# Patient Record
Sex: Female | Born: 1985 | Race: White | Hispanic: No | Marital: Single | Smoking: Never smoker
Health system: Southern US, Community
[De-identification: ages and names within clinical notes are randomized; demographics above are authoritative.]

## PROBLEM LIST (undated history)

## (undated) DIAGNOSIS — G809 Cerebral palsy, unspecified: Secondary | ICD-10-CM

## (undated) HISTORY — PX: LEG SURGERY: SHX1003

---

## 2016-10-11 ENCOUNTER — Inpatient Hospital Stay
Admission: EM | Admit: 2016-10-11 | Discharge: 2016-10-12 | DRG: 204 | Disposition: A | Discharge status: Home / Self Care | Payer: Medicare Other | Attending: Internal Medicine | Admitting: Internal Medicine

## 2016-10-11 ENCOUNTER — Emergency Department: Payer: Medicare Other

## 2016-10-11 DIAGNOSIS — L8990 Pressure ulcer of unspecified site, unspecified stage: Secondary | ICD-10-CM | POA: Insufficient documentation

## 2016-10-11 DIAGNOSIS — D696 Thrombocytopenia, unspecified: Secondary | ICD-10-CM | POA: Diagnosis present

## 2016-10-11 DIAGNOSIS — R6813 Apparent life threatening event in infant (ALTE): Secondary | ICD-10-CM

## 2016-10-11 DIAGNOSIS — Z79899 Other long term (current) drug therapy: Secondary | ICD-10-CM | POA: Diagnosis not present

## 2016-10-11 DIAGNOSIS — R1310 Dysphagia, unspecified: Secondary | ICD-10-CM | POA: Diagnosis present

## 2016-10-11 DIAGNOSIS — R9431 Abnormal electrocardiogram [ECG] [EKG]: Secondary | ICD-10-CM | POA: Diagnosis present

## 2016-10-11 DIAGNOSIS — R69 Illness, unspecified: Secondary | ICD-10-CM

## 2016-10-11 DIAGNOSIS — J690 Pneumonitis due to inhalation of food and vomit: Secondary | ICD-10-CM | POA: Diagnosis present

## 2016-10-11 DIAGNOSIS — G809 Cerebral palsy, unspecified: Secondary | ICD-10-CM | POA: Diagnosis present

## 2016-10-11 DIAGNOSIS — R0602 Shortness of breath: Secondary | ICD-10-CM | POA: Diagnosis not present

## 2016-10-11 HISTORY — DX: Cerebral palsy, unspecified: G80.9

## 2016-10-11 LAB — CBC WITH DIFFERENTIAL/PLATELET
BASOS ABS: 0 10*3/uL (ref 0–0.1)
Basophils Relative: 0 %
EOS ABS: 0 10*3/uL (ref 0–0.7)
EOS PCT: 0 %
HCT: 41.7 % (ref 35.0–47.0)
HEMOGLOBIN: 14.4 g/dL (ref 12.0–16.0)
LYMPHS PCT: 12 %
Lymphs Abs: 1.3 10*3/uL (ref 1.0–3.6)
MCH: 33 pg (ref 26.0–34.0)
MCHC: 34.5 g/dL (ref 32.0–36.0)
MCV: 95.8 fL (ref 80.0–100.0)
Monocytes Absolute: 0.2 10*3/uL (ref 0.2–0.9)
Monocytes Relative: 2 %
NEUTROS PCT: 86 %
Neutro Abs: 9.1 10*3/uL — ABNORMAL HIGH (ref 1.4–6.5)
PLATELETS: 76 10*3/uL — AB (ref 150–440)
RBC: 4.35 MIL/uL (ref 3.80–5.20)
RDW: 16.4 % — ABNORMAL HIGH (ref 11.5–14.5)
WBC: 10.6 10*3/uL (ref 3.6–11.0)

## 2016-10-11 LAB — COMPREHENSIVE METABOLIC PANEL
ALT: 21 U/L (ref 14–54)
AST: 24 U/L (ref 15–41)
Albumin: 2.8 g/dL — ABNORMAL LOW (ref 3.5–5.0)
Alkaline Phosphatase: 111 U/L (ref 38–126)
Anion gap: 11 (ref 5–15)
BILIRUBIN TOTAL: 0.4 mg/dL (ref 0.3–1.2)
BUN: 12 mg/dL (ref 6–20)
CO2: 26 mmol/L (ref 22–32)
Calcium: 9.7 mg/dL (ref 8.9–10.3)
Chloride: 105 mmol/L (ref 101–111)
Creatinine, Ser: 0.38 mg/dL — ABNORMAL LOW (ref 0.44–1.00)
Glucose, Bld: 129 mg/dL — ABNORMAL HIGH (ref 65–99)
Potassium: 3.5 mmol/L (ref 3.5–5.1)
Sodium: 142 mmol/L (ref 135–145)
TOTAL PROTEIN: 7.8 g/dL (ref 6.5–8.1)

## 2016-10-11 LAB — LIPASE, BLOOD: Lipase: 30 U/L (ref 11–51)

## 2016-10-11 LAB — LACTIC ACID, PLASMA: LACTIC ACID, VENOUS: 1.8 mmol/L (ref 0.5–1.9)

## 2016-10-11 LAB — TROPONIN I

## 2016-10-11 MED ORDER — SODIUM CHLORIDE 0.9 % IV BOLUS (SEPSIS)
500.0000 mL | Freq: Once | INTRAVENOUS | Status: AC
  Administered 2016-10-11: 500 mL via INTRAVENOUS

## 2016-10-11 NOTE — ED Provider Notes (Signed)
[facility] Emergency Department Provider Note   ____________________________________________   First MD Initiated Contact with Patient 10/11/16 2159     (approximate)  I have reviewed the triage vital signs and the nursing notes.   HISTORY  Chief Complaint Aspiration  EM caveat: The patient is nonverbal, history provided by family  HPI Deanna Mcdowell is a 31 y.o. female here for evaluation of abscesses shortness of breath and coughing  Mother reports patient has cerebral palsy, she was getting her to ensure shake this evening when the patient began coughing, she turned blue for us 4. Of time, and required significant suctioning. The patient's sister is a nurse was also present, they suctioned her and symptoms started improved which she's been having a cough for the last 3-4 days and they're concerned she may be developing pneumonia as well. They believe she also aspirated this evening  No fevers. He was on antibiotics about a month ago took Augmentin for a cough and possible congestion or bronchitis area had an x-ray done and was told there was no pneumonia at that time  Patient is otherwise been acting her normal health.   Past Medical History:  Diagnosis Date  . Cerebral palsy (HCC)     There are no active problems to display for this patient.   History reviewed. No pertinent surgical history.  Prior to Admission medications   Medication Sig Start Date End Date Taking? Authorizing Provider  baclofen (LIORESAL) 20 MG tablet Take 40 mg by mouth 2 (two) times daily.   Yes [provider]  docusate sodium (COLACE) 100 MG capsule Take 200 mg by mouth 2 (two) times daily.   Yes [provider]  lactulose (CHRONULAC) 10 GM/15ML solution Take 20 g by mouth daily.   Yes [provider]  polyethylene glycol (MIRALAX / GLYCOLAX) packet Take 17 g by mouth daily.   Yes [provider]  traZODone (DESYREL) 50 MG tablet Take  50 mg by mouth at bedtime.   Yes [provider]    Allergies Patient has no known allergies.  No family history on file.  Social History Social History  Substance Use Topics  . Smoking status: Never Smoker  . Smokeless tobacco: Never Used  . Alcohol use No    Review of Systems Constitutional: No fever/chills ENT: No sore throat.Had thrush and is on treatment for this. Cardiovascular: They've not noticed any pain in the chest Respiratory: See history of present illness Gastrointestinal: No abdominal pain.  No nausea, no vomiting.  No diarrhea.  No constipation. Genitourinary: No change in her urine Musculoskeletal: No swelling in any joints, she has chronic contractures in her arms and legs Skin: Negative for rash. Neurological: Negative for focal weakness or numbness. No change from her normal behavior and baseline.    ____________________________________________   PHYSICAL EXAM:  VITAL SIGNS: ED Triage Vitals  Enc Vitals Group     BP 10/11/16 2159 103/89     Pulse Rate 10/11/16 2159 99     Resp 10/11/16 2159 20     Temp --      Temp src --      SpO2 10/11/16 2159 97 %     Weight 10/11/16 2151 75 lb (34 kg)     Height 10/11/16 2151 5' (1.524 m)     Head Circumference --      Peak Flow --      Pain Score --      Pain Loc --  Pain Edu? --      Excl. in GC? --     Constitutional: Alert. Does not appear in extremis. Eyes: Conjunctivae are normal. Head: Atraumatic. Nose: No congestion/rhinnorhea. Mouth/Throat: Mucous membranes are lightly dry, adherent thrush-like flare noted on the tongue but not in the back of the oropharynx Neck: No stridor.   Cardiovascular: Normal rate, regular rhythm. Grossly normal heart sounds.  Good peripheral circulation. Respiratory: Mild use of accessory muscles. No retractions. Clear to auscultation the upper lobes, but centrally and in the lower fields some mild rales are detected. Saturation 94% on room air, placed  back on 2 L for comfort Gastrointestinal: Soft and nontender. No distention. Musculoskeletal: No lower extremity tenderness nor edema. Chronic contractures. Neurologic:  Does not speak. Sits calmly. Contractures and lower extremities. Skin:  Skin is warm, dry and intact. No rash noted. Psychiatric: Mood and affect are calm  ____________________________________________   LABS (all labs ordered are listed, but only abnormal results are displayed)  Labs Reviewed  COMPREHENSIVE METABOLIC PANEL - Abnormal; Notable for the following:       Result Value   Glucose, Bld 129 (*)    Creatinine, Ser 0.38 (*)    Albumin 2.8 (*)    All other components within normal limits  CBC WITH DIFFERENTIAL/PLATELET - Abnormal; Notable for the following:    RDW 16.4 (*)    Platelets 76 (*)    Neutro Abs 9.1 (*)    All other components within normal limits  LACTIC ACID, PLASMA  LIPASE, BLOOD  TROPONIN I  LACTIC ACID, PLASMA  MAGNESIUM   ____________________________________________  EKG  Reviewed injury by 2247 Heart rate 100 QRS 99 QTc 460 Normal sinus rhythm, somewhat unusual repolarization abnormality with T-wave inversions noted in inferolateral distribution. No obvious ischemia. Will add magnesium test. QT is still within normal.  ____________________________________________  RADIOLOGY  Dg Chest Portable 1 View  Result Date: 10/11/2016 CLINICAL DATA:  Cerebral palsy patient with possible aspiration. Nonverbal. Cough and dyspnea. EXAM: PORTABLE CHEST 1 VIEW COMPARISON:  None. FINDINGS: The heart size and mediastinal contours are within normal limits. Low lung volumes without evidence of aspiration pneumonia. No pulmonary consolidation, effusion or edema. No pneumothorax. No acute nor suspicious osseous abnormalities. There is mild S-shaped scoliotic curvature of the dorsal spine. Slightly high riding appearance both humeral heads may be due to projection. IMPRESSION: No active disease.  Electronically Signed   By: Tollie Eth M.D.   On: 10/11/2016 22:44    ____________________________________________   PROCEDURES  Procedure(s) performed: None  Procedures  Critical Care performed: No  ____________________________________________   INITIAL IMPRESSION / ASSESSMENT AND PLAN / ED COURSE  Pertinent labs & imaging results that were available during my care of the patient were reviewed by me and considered in my medical decision making (see chart for details).  Patient presents after an episode of turning blue with cyanosis witnessed by the patient's mother and sister who is a Engineer, civil (consulting). She had an event where she apparently aspirated and became very short of breath thereafter. Mom reports she has to take thickened diet, but they're concerned as she's had a slight cough for the last few days, was treated for pneumonia possibly a month ago, and then this evening had severe cyanosis which improved after suctioning at home and oxygen therapy provided by EMS.  Her overall exam is fairly reassuring, some minimal hypotension is noted. No fever or white count but she does have mild crackles noted in the lower lobes bilaterally  and I suspect she may very well have aspirated. She is at risk for decompensation especially given I suspect with her cerebral palsy she may have diminished reserved capacity. Minimal tachypnea and slight increased work of breathing. Does appear improved Floxin therapy but has not had any hypoxia here  I will start her on Levaquin and cover her for possible aspiration. Discussed with the family and they would be most comfortable with observing her overnight in the hospital, no think this is agreeable especially given the event she had with notable color change, dyspnea, and  cyanosis noted at home by family.     ____________________________________________   FINAL CLINICAL IMPRESSION(S) / ED DIAGNOSES  Final diagnoses:  Aspiration pneumonia of both lower  lobes, unspecified aspiration pneumonia type (HCC)  Apparent life threatening event  Abnormal EKG      NEW MEDICATIONS STARTED DURING THIS VISIT:  New Prescriptions   No medications on file     Note:  This document was prepared using Dragon voice recognition software and may include unintentional dictation errors.     Sharyn Creamer, MD 10/12/16 [phone]

## 2016-10-11 NOTE — ED Triage Notes (Addendum)
Pt arrives to ED via [facility] from home with c/o possible aspiration. Pt's mother reports pt has been sick x1 month, recently completed course of ABX for URI and had in home chest xray 3 weeks ago which came back normal. Mother reports pt possibly aspirated around 830pm tonight which is what caused her to come in for evaluation. Mother reports pt's color was "blueish" and "didn't look good"; mother reports other daughter is a Engineer, civil (consulting)nurse and suctioned the pt prior to EMS arrival. Mother denies c/o fever at home. EMS reported VS: BP 111/60, 97% O2, HR 95, CBG 123.

## 2016-10-12 ENCOUNTER — Encounter: Payer: Self-pay | Admitting: Internal Medicine

## 2016-10-12 DIAGNOSIS — J690 Pneumonitis due to inhalation of food and vomit: Secondary | ICD-10-CM | POA: Diagnosis present

## 2016-10-12 DIAGNOSIS — Z79899 Other long term (current) drug therapy: Secondary | ICD-10-CM | POA: Diagnosis not present

## 2016-10-12 DIAGNOSIS — G809 Cerebral palsy, unspecified: Secondary | ICD-10-CM | POA: Diagnosis present

## 2016-10-12 DIAGNOSIS — R1310 Dysphagia, unspecified: Secondary | ICD-10-CM | POA: Diagnosis present

## 2016-10-12 DIAGNOSIS — D696 Thrombocytopenia, unspecified: Secondary | ICD-10-CM | POA: Diagnosis present

## 2016-10-12 DIAGNOSIS — L8990 Pressure ulcer of unspecified site, unspecified stage: Secondary | ICD-10-CM | POA: Insufficient documentation

## 2016-10-12 DIAGNOSIS — R0602 Shortness of breath: Secondary | ICD-10-CM | POA: Diagnosis present

## 2016-10-12 DIAGNOSIS — R9431 Abnormal electrocardiogram [ECG] [EKG]: Secondary | ICD-10-CM | POA: Diagnosis present

## 2016-10-12 LAB — BASIC METABOLIC PANEL
ANION GAP: 5 (ref 5–15)
BUN: 9 mg/dL (ref 6–20)
CHLORIDE: 108 mmol/L (ref 101–111)
CO2: 29 mmol/L (ref 22–32)
Calcium: 9 mg/dL (ref 8.9–10.3)
Creatinine, Ser: 0.3 mg/dL — ABNORMAL LOW (ref 0.44–1.00)
GFR calc Af Amer: >60 mL/min (ref >60)
GFR calc non Af Amer: >60 mL/min (ref >60)
GLUCOSE: 69 mg/dL (ref 65–99)
POTASSIUM: 3.5 mmol/L (ref 3.5–5.1)
Sodium: 142 mmol/L (ref 135–145)

## 2016-10-12 LAB — CBC
HEMATOCRIT: 35.3 % (ref 35.0–47.0)
HEMOGLOBIN: 12.2 g/dL (ref 12.0–16.0)
MCH: 33.2 pg (ref 26.0–34.0)
MCHC: 34.5 g/dL (ref 32.0–36.0)
MCV: 96.3 fL (ref 80.0–100.0)
Platelets: 66 10*3/uL — ABNORMAL LOW (ref 150–440)
RBC: 3.67 MIL/uL — ABNORMAL LOW (ref 3.80–5.20)
RDW: 16.4 % — ABNORMAL HIGH (ref 11.5–14.5)
WBC: 10.1 10*3/uL (ref 3.6–11.0)

## 2016-10-12 LAB — MAGNESIUM: MAGNESIUM: 1.7 mg/dL (ref 1.7–2.4)

## 2016-10-12 MED ORDER — CLINDAMYCIN PHOSPHATE 600 MG/50ML IV SOLN
600.0000 mg | Freq: Three times a day (TID) | INTRAVENOUS | Status: DC
  Administered 2016-10-12: 600 mg via INTRAVENOUS
  Filled 2016-10-12 (×3): qty 50

## 2016-10-12 MED ORDER — ONDANSETRON HCL 4 MG/2ML IJ SOLN: 4.0000 mg | Freq: Four times a day (QID) | INTRAMUSCULAR | Status: DC | PRN

## 2016-10-12 MED ORDER — TRAZODONE HCL 50 MG PO TABS: 50.0000 mg | ORAL_TABLET | Freq: Every day | ORAL | Status: DC

## 2016-10-12 MED ORDER — BACLOFEN 10 MG PO TABS
40.0000 mg | ORAL_TABLET | Freq: Two times a day (BID) | ORAL | Status: DC
  Administered 2016-10-12: 40 mg via ORAL
  Filled 2016-10-12 (×3): qty 2

## 2016-10-12 MED ORDER — ACETAMINOPHEN 650 MG RE SUPP: 650.0000 mg | Freq: Four times a day (QID) | RECTAL | Status: DC | PRN

## 2016-10-12 MED ORDER — SODIUM CHLORIDE 0.9 % IV SOLN
INTRAVENOUS | Status: DC
  Administered 2016-10-12: 05:00:00 via INTRAVENOUS

## 2016-10-12 MED ORDER — ONDANSETRON HCL 4 MG PO TABS: 4.0000 mg | ORAL_TABLET | Freq: Four times a day (QID) | ORAL | Status: DC | PRN

## 2016-10-12 MED ORDER — POLYETHYLENE GLYCOL 3350 17 G PO PACK
17.0000 g | PACK | Freq: Every day | ORAL | Status: DC
  Filled 2016-10-12: qty 1

## 2016-10-12 MED ORDER — LEVOFLOXACIN IN D5W 750 MG/150ML IV SOLN
750.0000 mg | Freq: Once | INTRAVENOUS | Status: AC
  Administered 2016-10-12: 750 mg via INTRAVENOUS
  Filled 2016-10-12: qty 150

## 2016-10-12 MED ORDER — DOCUSATE SODIUM 100 MG PO CAPS
200.0000 mg | ORAL_CAPSULE | Freq: Two times a day (BID) | ORAL | Status: DC
  Administered 2016-10-12: 200 mg via ORAL
  Filled 2016-10-12: qty 2

## 2016-10-12 MED ORDER — LACTULOSE 10 GM/15ML PO SOLN
20.0000 g | Freq: Every day | ORAL | Status: DC
  Filled 2016-10-12: qty 30

## 2016-10-12 MED ORDER — ACETAMINOPHEN 325 MG PO TABS: 650.0000 mg | ORAL_TABLET | Freq: Four times a day (QID) | ORAL | Status: DC | PRN

## 2016-10-12 NOTE — ED Notes (Signed)
Admitting provider at bedside.

## 2016-10-12 NOTE — Discharge Summary (Signed)
Physician Discharge Summary  Patient ID: Deanna SimasSarah Mcdowell MRN: [id number] DOB/AGE: [birth year] 31 y.o.  Admit date: 10/11/2016 Discharge date: 10/12/2016  Admission Diagnoses:1. Aspiration   2. Cerebral palsy  Discharge Diagnoses:  1. Aspiration 2. Cerebral palsy  Discharged Condition: stable  Hospital Course: 1. Aspiration. Patient had cough and difficulty with her secretions. She's had recent upper respiratory issues which have been treated as an outpatient. It was thought that she may be developing aspiration pneumonia. However her chest x-ray did not reveal any infiltrates. Patient had speech therapy evaluation. Speech therapy spent time with the mother and they were able to improve patient's recent swallowing difficulties. At this point there was no need to consider alternative ways of feeding such as PEG tubes. She is going be discharged home to the care of her mother and will follow-up with her primary care doctor as needed. 2. Cerebral palsy. Patient's currently at her baseline.   Discharge Exam: Blood pressure 112/74, pulse 81, resp. rate 16, height 5' (1.524 m), weight 29.9 kg (66 lb), SpO2 95 %. General appearance: alert Resp: clear to auscultation bilaterally Cardio: regular rate and rhythm, S1, S2 normal, no murmur, click, rub or gallop  Disposition: Final discharge disposition not confirmed  Discharge Instructions    Diet - low sodium heart healthy    Complete by:  As directed    Increase activity slowly    Complete by:  As directed      Allergies as of 10/12/2016   No Known Allergies     Medication List    TAKE these medications   baclofen 20 MG tablet Commonly known as:  LIORESAL Take 40 mg by mouth 2 (two) times daily.   docusate sodium 100 MG capsule Commonly known as:  COLACE Take 200 mg by mouth 2 (two) times daily.   lactulose 10 GM/15ML solution Commonly known as:  CHRONULAC Take 20 g by mouth daily.   polyethylene glycol packet Commonly known as:   MIRALAX / GLYCOLAX Take 17 g by mouth daily.   traZODone 50 MG tablet Commonly known as:  DESYREL Take 50 mg by mouth at bedtime.        Signed: Gracelyn NurseJohnston,  Klaudia Beirne D 10/12/2016, 3:01 PM

## 2016-10-12 NOTE — Plan of Care (Signed)
Problem: Bowel/Gastric: Goal: Will not experience complications related to bowel motility Outcome: Completed/Met Date Met: 10/12/16 Pt has met goals for discharge.

## 2016-10-12 NOTE — Evaluation (Signed)
Clinical/Bedside Swallow Evaluation Patient Details  Name: Deanna Mcdowell MRN: [id number] Date of Birth: Sep 24, 1985  Today's Date: 10/12/2016 Time: SLP Start Time (ACUTE ONLY): 0945 SLP Stop Time (ACUTE ONLY): 1045 SLP Time Calculation (min) (ACUTE ONLY): 60 min  Past Medical History:  Past Medical History:  Diagnosis Date  . Cerebral palsy [facility](HCC)    Past Surgical History:  Past Surgical History:  Procedure Laterality Date  . LEG SURGERY     HPI:      Assessment / Plan / Recommendation Clinical Impression    SLP Visit Diagnosis: Dysphagia, oropharyngeal phase (R13.12)    Aspiration Risk  Moderate aspiration risk    Diet Recommendation     Medication Administration: Whole meds with puree (crushed in puree as needed)    Other  Recommendations Recommended Consults:  (Dietician consult and f/u) Oral Care Recommendations: Oral care BID;Staff/trained caregiver to provide oral care Other Recommendations: Order thickener from pharmacy;Prohibited food (jello, ice cream, thin soups);Remove water pitcher;Have oral suction available   Follow up Recommendations Home health SLP (pt discharging today)      Frequency and Duration            Prognosis Prognosis for Safe Diet Advancement: Fair Barriers to Reach Goals: Cognitive deficits;Severity of deficits;Time post onset      Swallow Study   General Date of Onset: 10/11/16 Type of Study: Bedside Swallow Evaluation Previous Swallow Assessment: none indicated recently Diet Prior to this Study: Dysphagia 1 (puree);Nectar-thick liquids (and thin liquids at times at home per mother's report) Temperature Spikes Noted: No (wbc 10.1) Respiratory Status: Nasal cannula (4 liters) History of Recent Intubation: No Behavior/Cognition: Cooperative;Pleasant mood;Confused;Distractible;Requires cueing (awake; baseline dx of Cerebral Palsy w/ Cognitive decline) Oral Cavity Assessment: Within Functional Limits;Excessive secretions (min) Oral Care  Completed by SLP: Recent completion by staff Oral Cavity - Dentition: Adequate natural dentition Vision:  (n/a) Self-Feeding Abilities: Total assist Patient Positioning: Upright in bed (supported w/ pillows) Baseline Vocal Quality:  (nonverbal) Volitional Cough: Cognitively unable to elicit Volitional Swallow: Unable to elicit    Oral/Motor/Sensory Function Overall Oral Motor/Sensory Function: Moderate impairment Facial Strength:  (decreased tone) Lingual Strength:  (decreased tone)   Ice Chips Ice chips: Not tested   Thin Liquid      Nectar Thick     Honey Thick     Puree     Solid   GO        Functional Assessment Tool Used: clinical judgement Functional Limitations: Swallowing Swallow Current Status ([id number]): At least 40 percent but less than 60 percent impaired, limited or restricted Swallow Goal Status [phone]): At least 40 percent but less than 60 percent impaired, limited or restricted Swallow Discharge Status [phone]): At least 40 percent but less than 60 percent impaired, limited or restricted     Jerilynn SomKatherine Tomie Elko, MS, CCC-SLP Thurmond Hildebran 10/12/2016,6:53 PM

## 2016-10-12 NOTE — Progress Notes (Signed)
Initial Nutrition Assessment  DOCUMENTATION CODES:   Severe malnutrition in context of acute illness/injury, Underweight  INTERVENTION:  Provide Mighty Shake II TID with meals, each supplement provides 480-500 kcals and 20-23 grams of protein. Patient prefers vanilla.  Provide Magic cup TID with meals, each supplement provides 290 kcal and 9 grams of protein. Patient prefers vanilla.  Will monitor for SLP progress notes. Pending patient's intake, she may benefit from work-up for PEG tube for nutrition/hydration if she continues to receive full scope treatment.  NUTRITION DIAGNOSIS:   Malnutrition (Severe) related to acute illness (congestion, thrush) as evidenced by 11.9 percent weight loss over 1 month, severe depletion of body fat.  GOAL:   Patient will meet greater than or equal to 90% of their needs  MONITOR:   PO intake, Supplement acceptance, Labs, Weight trends, Skin, I & O's  REASON FOR ASSESSMENT:   Malnutrition Screening Tool    ASSESSMENT:   31 year old female with PMHx of cerebral palsy who presents with congestion, shortness of breath, aspiration PNA.    Spoke with patient's mom Patrice at bedside. She reports patient has had poor appetite and decreased intake for the past 4 weeks. She developed congestion first, which worsened her appetite. Then she was placed on antibiotics and developed thrush. Mom reports patient is on a puree diet (dysphagia 1) with nectar-thick liquids at home. She typically eats 3 meals per day of home cooked meals of frozen meals. She enjoys vegetable lasagna pureed and stuffed peppers pureed. She also has 4 bottles of Ensure Plus per day usually (350 kcal and 13 grams of protein per bottle), but has not been able to tolerate well this past month due to congestion. She also typically has 5 cups of thickened liquids per day. Chronic constipation at baseline - usually only has 3 bowel movements per week. Patient is not tolerating oral intake well  today. Mother reports she only had bites of thickened broth.   Patient's UBW is 74-78 lbs, but reports she is usually closer to 74 lbs. Per chart she was 74.9 lbs on 07/25/2016. Patient's mom reports she lost 8.9 lbs (11.9% body weight) over the past month, which is significant for time frame.  Medications reviewed and include: baclofen, Colace, lactulose, Miralax, NS @ 50 ml/hr, clindamycin.   Labs reviewed: Creatinine 0.3.   Nutrition-Focused physical exam completed. Findings are severe fat depletion, severe muscle depletion, and no edema. Would expect wasting of muscle mass as patient is bed-bound.  Discussed with RN. Patient was unable to swallow pills and required suctioning. SLP has been consulted.  Diet Order:  DIET - DYS 1 Room service appropriate? Yes with Assist; Fluid consistency: Nectar Thick  Skin:  Wound (see comment) (unstageable to left heel)  Last BM:  PTA (10/10/2016 per chart)  Height:   Ht Readings from Last 1 Encounters:  10/11/16 5' (1.524 m)    Weight:   Wt Readings from Last 1 Encounters:  10/12/16 66 lb (29.9 kg)    Ideal Body Weight:  45.45 kg  BMI:  Body mass index is 12.89 kg/m.  Estimated Nutritional Needs:   Kcal:  1225-1415 (MSJ x 1.3-1.5)  Protein:  45-60 grams (1.5-2 grams/kg)  Fluid:  1.2-1.4 L/day  EDUCATION NEEDS:   No education needs identified at this time  Helane RimaLeanne Naia Ruff, MS, RD, LDN Pager: [phone] After Hours Pager: [phone]

## 2016-10-12 NOTE — Progress Notes (Signed)
Shift assessment completed. Pt is awake, non verbal but moans to discomfort. Pt is on room air, lungs are clear bilat, Hr is regular, telebox intact. Abdomen is soft, bs hypoactive. Pt is incontinent of urine prn,ppp, pt has heels elevated and foot pumps in place. Dark colored unstageable area noted, dry, to L heel. piv intact to R arm with iv ns infusing at 4150mls/hr per md order. Per mother at bedside, pt has sustained a 9-10# weight loss over the past 4 weeks, pt has also been having a lot of congestions and has completed antibiotics outpatient, which resulted in a yeast infection to her groin and thrush. Per mother, pt received medication for thrush 6 days ago, and then again 3 days ago. Pt exhibited loose cough and swallowed during assessment. Since assessment, mother stated she attempted to give patient some thickened broth, but pt coughed after a couple spoonfuls, pt now has some upper airway sounds evident without auscultation. This writer attempted to give pt one pill in applesauce, pt did not follow command to swallow, and did not swallow on her own either. This writer suctioned medication out of patient's mouth, pt biting on suction catheter, pt's two upper front teeth are missing. Pt's eyes are open and she is looking around the room, does not turn her head to this Clinical research associatewriter when spoken to. Mother has call bell in reach.

## 2016-10-12 NOTE — Progress Notes (Signed)
Pt to be dc'd. Iv removed with catheter intact, mother has received discharge paperwork. Awaiting ride.

## 2016-10-12 NOTE — H&P (Signed)
[facility] -  at [facility]   PATIENT NAME: Deanna Mcdowell    MR#:  [id number]  DATE OF BIRTH:  [birth year]  DATE OF ADMISSION:  10/11/2016  PRIMARY CARE PHYSIC[facility]   REQUESTING/REFERRING PHYSICIAN:   CHIEF COMPLAINT:   Chief Complaint  Patient presents with  . Aspiration    HISTORY OF PRESENT ILLNESS: Deanna Mcdowell  is a 31 y.o. female with a known history of Cerebral palsy who is dependent on instrument electively is of daily living and activities of daily living was brought by mother to the emergency room for cough and congestion. Around 8:30 PM last night patient suddenly had severe shortness of breath and had turned blue. She required a significant suctioning. Patient's sister who is a nurse was also present at that time and the patient was suctioned and her symptoms started improving after that. Patient was treated for cough and congestion with antibiotics in the past and she has been having this congestion for the last 4-6 weeks. Patient took a course of Augmentin for cough and congestion. She usually takes pured diet at home. She is mostly bedbound and total care dependent. Patient is nonverbal most of the history obtained from patient's mother. In the emergency room she was put on oxygen via nasal cannula and given IV antibiotic.  PAST MEDICAL HISTORY:   Past Medical History:  Diagnosis Date  . Cerebral palsy (HCC)     PAST SURGICAL HISTORY: Past Surgical History:  Procedure Laterality Date  . LEG SURGERY      SOCIAL HISTORY:  Social History  Substance Use Topics  . Smoking status: Never Smoker  . Smokeless tobacco: Never Used  . Alcohol use No    FAMILY HISTORY:  Family History  Problem Relation Age of Onset  . Cancer Father   . Diabetes Neg Hx   . Hypertension Neg Hx   . COPD Neg Hx     DRUG ALLERGIES: No Known Allergies  REVIEW OF SYSTEMS:  Could not be obtained as patient is nonverbal  and has cerebral palsy  MEDICATIONS AT HOME:  Prior to Admission medications   Medication Sig Start Date End Date Taking? Authorizing Provider  baclofen (LIORESAL) 20 MG tablet Take 40 mg by mouth 2 (two) times daily.   Yes [provider]  docusate sodium (COLACE) 100 MG capsule Take 200 mg by mouth 2 (two) times daily.   Yes [provider]  lactulose (CHRONULAC) 10 GM/15ML solution Take 20 g by mouth daily.   Yes [provider]  polyethylene glycol (MIRALAX / GLYCOLAX) packet Take 17 g by mouth daily.   Yes [provider]  traZODone (DESYREL) 50 MG tablet Take 50 mg by mouth at bedtime.   Yes [provider]      PHYSICAL EXAMINATION:   VITAL SIGNS: Blood pressure 120/83, pulse 80, resp. rate (!) 21, height 5' (1.524 m), weight 34 kg (75 lb), SpO2 96 %.  GENERAL:  31 y.o.-year-old patient lying in the bed on oxygen via nasal cannula EYES: Pupils equal, round, reactive to light and accommodation. No scleral icterus. Extraocular muscles intact.  HEENT: Head atraumatic, normocephalic. Oropharynx and nasopharynx clear.  NECK:  Supple, no jugular venous distention. No thyroid enlargement, no tenderness.  LUNGS: Decreased breath sounds bilaterally, no wheezing,scattered rales heard. No use of accessory muscles of respiration.  CARDIOVASCULAR: S1, S2 normal. No murmurs, rubs, or gallops.  ABDOMEN: Soft, nontender, nondistended. Bowel sounds present. No  organomegaly or mass.  EXTREMITIES: No pedal edema, cyanosis, or clubbing.  NEUROLOGIC: Awake, non verbal Developmentally retarded PSYCHIATRIC: could not be assessed SKIN: No obvious rash, lesion, or ulcer.   LABORATORY PANEL:   CBC  Recent Labs Lab 10/11/16 2239  WBC 10.6  HGB 14.4  HCT 41.7  PLT 76*  MCV 95.8  MCH 33.0  MCHC 34.5  RDW 16.4*  LYMPHSABS 1.3  MONOABS 0.2  EOSABS 0.0  BASOSABS 0.0    ------------------------------------------------------------------------------------------------------------------  Chemistries   Recent Labs Lab 10/11/16 2239  NA 142  K 3.5  CL 105  CO2 26  GLUCOSE 129*  BUN 12  CREATININE 0.38*  CALCIUM 9.7  MG 1.7  AST 24  ALT 21  ALKPHOS 111  BILITOT 0.4   ------------------------------------------------------------------------------------------------------------------ estimated creatinine clearance is 55.2 mL/min (A) (by C-G formula based on SCr of 0.38 mg/dL (L)). ------------------------------------------------------------------------------------------------------------------ No results for input(s): TSH, T4TOTAL, T3FREE, THYROIDAB in the last 72 hours.  Invalid input(s): FREET3   Coagulation profile No results for input(s): INR, PROTIME in the last 168 hours. ------------------------------------------------------------------------------------------------------------------- No results for input(s): DDIMER in the last 72 hours. -------------------------------------------------------------------------------------------------------------------  Cardiac Enzymes  Recent Labs Lab 10/11/16 2239  TROPONINI <0.03   ------------------------------------------------------------------------------------------------------------------ Invalid input(s): POCBNP  ---------------------------------------------------------------------------------------------------------------  Urinalysis No results found for: COLORURINE, APPEARANCEUR, LABSPEC, PHURINE, GLUCOSEU, HGBUR, BILIRUBINUR, KETONESUR, PROTEINUR, UROBILINOGEN, NITRITE, LEUKOCYTESUR   RADIOLOGY: Dg Chest Portable 1 View  Result Date: 10/11/2016 CLINICAL DATA:  Cerebral palsy patient with possible aspiration. Nonverbal. Cough and dyspnea. EXAM: PORTABLE CHEST 1 VIEW COMPARISON:  None. FINDINGS: The heart size and mediastinal contours are within normal limits. Low lung volumes without  evidence of aspiration pneumonia. No pulmonary consolidation, effusion or edema. No pneumothorax. No acute nor suspicious osseous abnormalities. There is mild S-shaped scoliotic curvature of the dorsal spine. Slightly high riding appearance both humeral heads may be due to projection. IMPRESSION: No active disease. Electronically Signed   By: Tollie Eth M.D.   On: 10/11/2016 22:44    EKG: Orders placed or performed during the hospital encounter of 10/11/16  . ED EKG 12-Lead  . ED EKG  . ED EKG 12-Lead  . ED EKG  . EKG 12-Lead  . EKG 12-Lead  . EKG 12-Lead  . EKG 12-Lead    IMPRESSION AND PLAN: 31 year old female patient with history of cerebral palsy presented to the emergency room with cough and congestion and shortness of breath. Admitting diagnosis 1. Aspiration pneumonia 2. Thrombocytopenia 3. Cerebral palsy 4. Dyspnea Treatment plan Admit patient to medical floor IV fluid hydration Start patient on IV clindamycin antibiotic DVT prophylaxis sequential compression devices to lower extremities Supplement oxygen Pured diet  All the records are reviewed and case discussed with ED provider. Management plans discussed with the patient, family and they are in agreement.  CODE STATUS:FULL CODE Surrogate Decision maker : Mother Code Status History    This patient does not have a recorded code status. Please follow your organizational policy for patients in this situation.       TOTAL TIME TAKING CARE OF THIS PATIENT: 50 minutes.    Ihor Austin M.D on 10/12/2016 at 3:10 AM  Between 7am to 6pm - Pager - [phone]  After 6pm go to [email or web address] - password [email or web address] [facility]  [place]  [facility]  Office  [phone]  CC: Primary care physician; [facility]

## 2016-10-12 NOTE — Progress Notes (Signed)
Pt has had swallow eval completed, telebox has been dc'd.

## 2016-10-12 NOTE — Care Management Important Message (Signed)
Important Message  Patient Details  Name: Deanna SimasSarah Mcdowell MRN: [id number] Date of Birth: [birth year]   Medicare Important Message Given:  N/A - LOS <3 / Initial given by admissions    Marily MemosLisa M Joab Carden, RN 10/12/2016, 3:15 PM

## 2016-10-13 LAB — HIV ANTIBODY (ROUTINE TESTING W REFLEX): HIV SCREEN 4TH GENERATION: NONREACTIVE

## 2018-06-12 IMAGING — DX DG CHEST 1V PORT
1 series · 1 of 1 positions shown · non-contrast
Comparison: None.

CLINICAL DATA: Cerebral palsy patient with possible aspiration.
Nonverbal. Cough and dyspnea.

EXAM:
PORTABLE CHEST 1 VIEW

[chest ap]
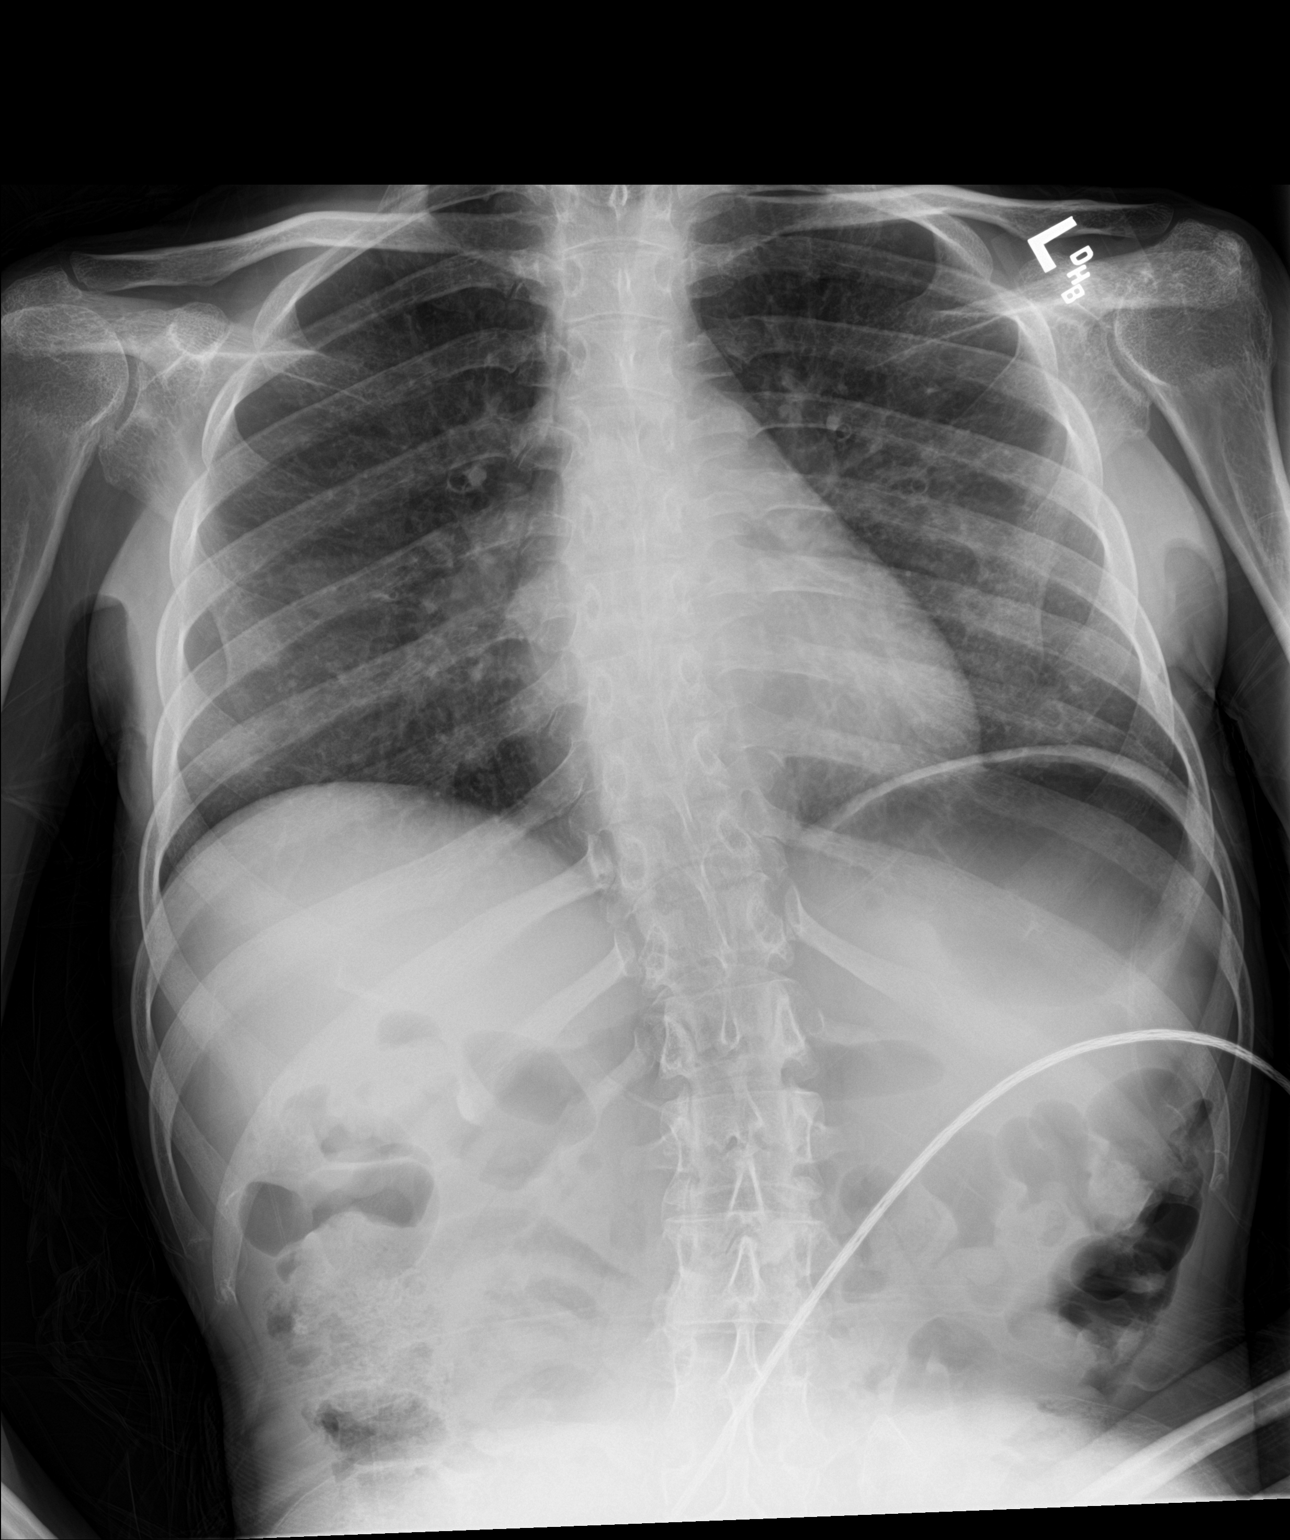

[1 of 1 positions shown; findings below may reference images not displayed]

FINDINGS: The heart size and mediastinal contours are within normal limits.
Low lung volumes without evidence of aspiration pneumonia. No
pulmonary consolidation, effusion or edema. No pneumothorax. No
acute nor suspicious osseous abnormalities. There is mild S-shaped
scoliotic curvature of the dorsal spine. Slightly high riding
appearance both humeral heads may be due to projection.
IMPRESSION: No active disease.

## 2024-04-10 DEATH — deceased
